# Patient Record
Sex: Female | Born: 1960 | Race: White | Hispanic: No | Marital: Married | Smoking: Never smoker
Health system: Southern US, Community
[De-identification: ages and names within clinical notes are randomized; demographics above are authoritative.]

## PROBLEM LIST (undated history)

## (undated) DIAGNOSIS — R748 Abnormal levels of other serum enzymes: Secondary | ICD-10-CM

## (undated) DIAGNOSIS — R42 Dizziness and giddiness: Secondary | ICD-10-CM

## (undated) DIAGNOSIS — R7301 Impaired fasting glucose: Secondary | ICD-10-CM

## (undated) DIAGNOSIS — H6690 Otitis media, unspecified, unspecified ear: Secondary | ICD-10-CM

## (undated) DIAGNOSIS — C4491 Basal cell carcinoma of skin, unspecified: Secondary | ICD-10-CM

## (undated) DIAGNOSIS — M7989 Other specified soft tissue disorders: Secondary | ICD-10-CM

## (undated) DIAGNOSIS — M545 Low back pain, unspecified: Secondary | ICD-10-CM

## (undated) HISTORY — DX: Basal cell carcinoma of skin, unspecified: C44.91

## (undated) HISTORY — PX: INNER EAR SURGERY: SHX679

## (undated) HISTORY — DX: Dizziness and giddiness: R42

## (undated) HISTORY — DX: Abnormal levels of other serum enzymes: R74.8

## (undated) HISTORY — DX: Other specified soft tissue disorders: M79.89

## (undated) HISTORY — DX: Low back pain, unspecified: M54.50

## (undated) HISTORY — DX: Impaired fasting glucose: R73.01

---

## 1999-07-08 ENCOUNTER — Encounter: Payer: Self-pay | Admitting: Obstetrics and Gynecology

## 1999-07-08 ENCOUNTER — Ambulatory Visit (HOSPITAL_COMMUNITY): Admission: RE | Admit: 1999-07-08 | Discharge: 1999-07-08 | Payer: Self-pay | Admitting: Obstetrics and Gynecology

## 1999-09-13 ENCOUNTER — Ambulatory Visit (HOSPITAL_COMMUNITY): Admission: RE | Admit: 1999-09-13 | Discharge: 1999-09-13 | Payer: Self-pay | Admitting: Obstetrics and Gynecology

## 1999-11-10 ENCOUNTER — Encounter (HOSPITAL_COMMUNITY): Admission: RE | Admit: 1999-11-10 | Discharge: 1999-12-06 | Payer: Self-pay | Admitting: Obstetrics and Gynecology

## 1999-11-17 ENCOUNTER — Encounter: Payer: Self-pay | Admitting: Obstetrics and Gynecology

## 1999-12-05 ENCOUNTER — Inpatient Hospital Stay (HOSPITAL_COMMUNITY): Admission: AD | Admit: 1999-12-05 | Discharge: 1999-12-07 | Payer: Self-pay | Admitting: Obstetrics and Gynecology

## 1999-12-08 ENCOUNTER — Encounter (HOSPITAL_COMMUNITY): Admission: RE | Admit: 1999-12-08 | Discharge: 2000-03-07 | Payer: Self-pay | Admitting: Obstetrics and Gynecology

## 2000-06-25 ENCOUNTER — Encounter: Payer: Self-pay | Admitting: Otolaryngology

## 2000-06-25 ENCOUNTER — Encounter: Admission: RE | Admit: 2000-06-25 | Discharge: 2000-06-25 | Payer: Self-pay | Admitting: Otolaryngology

## 2000-09-28 ENCOUNTER — Ambulatory Visit (HOSPITAL_BASED_OUTPATIENT_CLINIC_OR_DEPARTMENT_OTHER): Admission: RE | Admit: 2000-09-28 | Discharge: 2000-09-29 | Payer: Self-pay | Admitting: Otolaryngology

## 2000-09-28 ENCOUNTER — Encounter (INDEPENDENT_AMBULATORY_CARE_PROVIDER_SITE_OTHER): Payer: Self-pay | Admitting: *Deleted

## 2003-01-07 ENCOUNTER — Other Ambulatory Visit: Admission: RE | Admit: 2003-01-07 | Discharge: 2003-01-07 | Payer: Self-pay | Admitting: Obstetrics and Gynecology

## 2004-08-24 ENCOUNTER — Other Ambulatory Visit: Admission: RE | Admit: 2004-08-24 | Discharge: 2004-08-24 | Payer: Self-pay | Admitting: Obstetrics and Gynecology

## 2008-07-17 ENCOUNTER — Ambulatory Visit (HOSPITAL_COMMUNITY): Admission: RE | Admit: 2008-07-17 | Discharge: 2008-07-17 | Payer: Self-pay | Admitting: Obstetrics and Gynecology

## 2010-05-31 LAB — CBC
HCT: 38.1 % (ref 36.0–46.0)
Hemoglobin: 13.2 g/dL (ref 12.0–15.0)
MCHC: 34.6 g/dL (ref 30.0–36.0)
MCV: 93.3 fL (ref 78.0–100.0)
Platelets: 162 10*3/uL (ref 150–400)
RBC: 4.08 MIL/uL (ref 3.87–5.11)
RDW: 13.3 % (ref 11.5–15.5)
WBC: 6.1 10*3/uL (ref 4.0–10.5)

## 2010-05-31 LAB — PREGNANCY, URINE: Preg Test, Ur: NEGATIVE

## 2010-07-05 NOTE — Op Note (Signed)
NAMEBULA, CAVALIERI               ACCOUNT NO.:  [id number]   MEDICAL RECORD NO.:  [id number]          PATIENT TYPE:  AMB   LOCATION:  SDC                           FACILITY:  [facility]   PHYSICIAN:  Leighton Roach Meisinger, M.D.DATE OF BIRTH:  August 17, 1960   DATE OF PROCEDURE:  07/17/2008  DATE OF DISCHARGE:                               OPERATIVE REPORT   PREOPERATIVE DIAGNOSIS:  Mass in the right mons   POSTOPERATIVE DIAGNOSIS:  Questionable mass in the right mons.   PROCEDURE:  Exploration of the right mons.   SURGEON:  Zenaida Niece, MD   ANESTHESIA:  Local.   FINDINGS:  Externally, there was a possible mobile mass in the upper  right mons.  However, internally through an incision, no specific masses  palpated.   SPECIMENS:  None.   ESTIMATED BLOOD LOSS:  Minimal.   COMPLICATIONS:  None.   PROCEDURE IN DETAIL:  The patient was taken to the operating room and  placed in the dorsal supine position.  Prior to any anesthesia, I  palpated her right mons what was a fairly well-defined mass before I was  not able to palpate.  I was able to palpate a possible mobile mass in  the right superior mons.  This area was shaved and then prepped with  Betadine and draped with towels.  I was then able to palpate the area  and infiltrated 0.5% Marcaine with epinephrine over the area where a  mass was possibly palpated.  A 3-cm horizontal incision was then made.  This was carried with hemostats down to the layer just above the pubic  bone.  No unusual tissue or mass was encountered.  I put a finger in the  incision and palpated 360 degrees and palpated her entire mons with one  finger in the incision and I was unable to palpate a specific mass for  removal.  This was done very diligently over several minutes to make  sure that I was not missing anything.  No specific mass was palpated.  The patient was awake, so I discussed this with her during the case.  The deep layers were reapproximated with  figure-of-eight suture of 2-0  Vicryl.  Skin was closed with a subcuticular suture of 4-0 Vicryl  followed by Dermabond.  The patient tolerated the procedure well and was  taken to the recovery in stable condition.  Counts were correct.      Zenaida Niece, M.D.  Electronically Signed    TDM/MEDQ  D:  07/17/2008  T:  07/18/2008  Job:  [id number]

## 2010-07-08 NOTE — Op Note (Signed)
[place]  Patient:    Meagan Washington, Meagan Washington                      MRN: [id number] Proc. Date: 09/28/00 Adm. Date:  [id number] Disc. 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  POSTOPERATIVE DIAGNOSIS:  Mastoid and middle ear cholesteatoma.  OPERATION PERFORMED:  Canal wall down tympanomastoidectomy (modified radical mastoidectomy).  SURGEON:  Jefry H. Pollyann Kennedy, M.D.  ANESTHESIA:  General endotracheal.  COMPLICATIONS:  None.  FINDINGS:  Cholesteatoma matrix filling the superior two thirds of the mastoid cavity, extending high into the sinodural angle, extending superiorly just medial to the lower aspect of the tegmen.  Filling the aditus and epitympanum, filling the superior 50% of the middle ear cavity and extending into the eustachian tube orifice as well and as well as into the zygomatic process. Facial nerve canal was all intact except for a small maybe 2 or 3 mm area of the external genu.  Ossicular chain:  The head of the malleus was severely diseased and carious.  The lenticular process of the incus was eroded.  The capitulum of the lower aspect of the capitulum of the stapes and one of the crura was partially intact.  The remainder of the superstructure could not be identified.  The oval window was filled with fibrotic type tissue and was left unmolested.  The semicircular canals were intact.  There was no bony fistula. The patient tolerated the procedure well, was awakened, extubated and transferred to recovery in good condition.  In recovery, the tuning fork lateralized to the left ear with a Weber examination and her facial nerve was all intact.  INDICATIONS FOR PROCEDURE:  The patient is a 50 year old with a two-year history of chronic draining ear with aural polyps not able to  be cleared using medical therapy.  The risks, benefits, alternatives and complications of the procedure were explained to the patient who seemed to understand and agreed to surgery.  DESCRIPTION OF PROCEDURE:  The patient was taken to the operating room and placed on the operating table in the supine position.  Following induction of general endotracheal anesthesia, the ear was prepped and draped in standard fashion.  1% Xylocaine with epinephrine was infiltrated into the postauricular sulcus and four quadrants of the external auditory canal.  A vascular strip was created in the posterior ear canal and the postauricular incision was accomplished using a #10 blade.  The ear was brought forward.  Temporalis fascia and loose areolar tissue were both harvested, pressed and dried on the back table.  The mastoid periosteum was incised at the linea temporalis and vertically and dissected off the mastoid process.  Mastoidectomy was then performed using a combination of different sized cutting and polishing diamond burs.  As the cortex was opened, the cholesteatoma was immediately identified. A complete mastoidectomy was performed exposing the hard bone of the otic capsule, the three semicircular canals.  The bone was kept thick around the sigmoid sinus as there was no disease inferiorly in this area.  The sinodural angle was dissected until all of the cholesteatoma matrix could adequately be removed.  The dissection was continued through anterior to the zygomatic arch root until all disease could  be removed.  The canal wall was taken down after it was determined that there was extensive disease in the anterior tympanic cavity as well as the facial recess and sinus tympany area.  All the above-mentioned findings were noted during the dissection.  Care was taken not to inadvertently drill on the ossicles and care was taken to avoid the exposed area of the facial nerve.  The canal wall was taken down  all the way to the facial ridge which was kept intact.  All disease was removed.  The malleus head was removed using malleus nippers removing all of the disease from the anterior epitympanum.  The incus was separated from the stapes capitulum and was completely removed as well.  The small piece of stapes superstructure was encased within the fibrotic tissue that was found in the oval window area and that was left in place.  The round window niche was clear.  The hypotympanum was clear of disease as well.  The bony bridge overlying the fossa incudus was removed and a wide open cavity was created.  All diseased epithelium was removed and sent for pathologic evaluation.  The anterior half of the tympanic membrane was kept intact.  The posterior half was removed with the disease. The graft was then placed under the tympanic membrane and draped along the aditus area and into the mastoid cavity.  Saline soaked Gelfoam was used to support the graft to the middle ear and the remainder of the cavity was packed with Cortisporin soaked Gelfoam.  A large meatoplasty was accomplished by incising the skin and the cartilage of the inferior and  superior incisura and tacking back the conchal bowl using chromic suture.  The postauricular incision was reapproximated with chromic suture and benzoin and Steri-Strips. The remainder of the cavity was packed with Gelfoam soaked in Cortisporin and then with Adaptic dressing soaked in ointment.  A mastoid dressing was then applied.  The patient was then awakened, extubated and transferred to recovery in stable condition. DD:  10/01/00 TD:  10/01/00 Job: [id number]

## 2010-07-08 NOTE — Discharge Summary (Signed)
[facility]  Patient:    Meagan Washington, Meagan Washington                      MRN: [id number] Adm. Date:  [id number] Disc. Date: [id number] Attending:  Michaele Offer                           Discharge Summary  ADMISSION DIAGNOSES:          1. Intrauterine pregnancy at 39 weeks.                               2. Advanced maternal age.  DISCHARGE DIAGNOSES:          1. Intrauterine pregnancy at 39 weeks.                               2. Advanced maternal age.  PROCEDURE:                    Spontaneous vaginal delivery.  COMPLICATIONS:                None.  CONSULTATIONS:                None.  HISTORY AND PHYSICAL:         This is a 50 year old white female gravida 6, para 2-0-3-2 with an EGA of 39+ weeks by a 10-week ultrasound with an EDC of December 07, 1999, who presents for induction due to a favorable cervix.  She has had normal fetal movement.  No bleeding or ruptured membranes and occasional contractions.  Prenatal care complicated by advanced maternal age and the patient declined amniocentesis and triple screen and had a normal ultrasound.  She took baby aspirin throughout the pregnancy for history of spontaneous abortion x 3.  She is Rh negative and received RhoGAM and recently measured size less than dates and has had reactive nonstress test and ultrasound on November 17, 1999, revealed an estimated fetal weight of approximately 2800 g with a normal amniotic fluid volume.  PRENATAL LABS:                Blood type A negative with negative antibody screen. RPR nonreactive. Rubella immune. Hepatitis B surface antigen negative. Gonorrhea and chlamydia negative.  Glucola was 117 and group B Strep is negative.  PAST OB HISTORY:              Three spontaneous abortions and she had two D&Cs for this.  In 1996, vaginal delivery at 39 weeks, 8 pounds 10 ounces without complications.  In 1998, vaginal delivery at 39 weeks, 8 pounds 9 ounces without  complications.  PAST SURGICAL HISTORY:        She had jaw surgery and the two D&Cs.  The remainder of her history is noncontributory.  PHYSICAL EXAMINATION:  VITAL SIGNS:                  She is afebrile with stable vital signs. Fetal heart tracing is reactive.  She has irregular contractions.  ABDOMEN:                      Her abdomen is gravid and nontender with an estimated fetal weight of 8 pounds.  VAGINAL:  Vaginal examination is 4, 30, -2 with a vertex presentation and an adequate pelvis.  HOSPITAL COURSE:              The patient was admitted and had artificial rupture of membranes performed for labor induction.  She then progressed into labor on her own but did have a protracted course once she reached 5 cm.  She received an epidural and was started on Pitocin.  With minimal Pitocin she progressed to complete and pushed well.  On the afternoon of December 05, 1999, she had an SVD of a viable female infant with Apgars of 8 and 9 that weighed 7 pounds 3 ounces over a second degree laceration.  There was a nuchal cord x 1 which was reduced.  Placenta delivered spontaneously and was intact.  Her laceration was repaired with 3-0 Vicryl with local block.  Estimated blood loss was approximately 500 cc.  Postpartum she did very well and breast-fed her baby without complications and remained afebrile.  Predelivery hemoglobin was 11.9, post delivery was 11.3.  On the morning of postpartum day #2 she was stable for discharge home.  CONDITION ON DISCHARGE:       Stable.  DISPOSITION:                  The patient is discharged to home.  DISCHARGE INSTRUCTIONS:       1. Diet is regular.                               2. Activity is pelvic rest.                               3. She will follow up in four to six weeks.                               4. She was given our discharge pamphlet. DD:  12/07/99 TD:  12/07/99 Job: [id number]

## 2014-08-22 ENCOUNTER — Encounter (HOSPITAL_COMMUNITY): Payer: Self-pay | Admitting: Nurse Practitioner

## 2014-08-22 ENCOUNTER — Emergency Department (HOSPITAL_COMMUNITY)
Admission: EM | Admit: 2014-08-22 | Discharge: 2014-08-22 | Disposition: A | Discharge status: Home / Self Care | Payer: BLUE CROSS/BLUE SHIELD | Attending: Emergency Medicine | Admitting: Emergency Medicine

## 2014-08-22 DIAGNOSIS — H8110 Benign paroxysmal vertigo, unspecified ear: Secondary | ICD-10-CM | POA: Insufficient documentation

## 2014-08-22 DIAGNOSIS — R42 Dizziness and giddiness: Secondary | ICD-10-CM | POA: Diagnosis present

## 2014-08-22 HISTORY — DX: Otitis media, unspecified, unspecified ear: H66.90

## 2014-08-22 LAB — CBG MONITORING, ED: Glucose-Capillary: 139 mg/dL — ABNORMAL HIGH (ref 65–99)

## 2014-08-22 MED ORDER — DIAZEPAM 5 MG PO TABS: 5.0000 mg | ORAL_TABLET | Freq: Two times a day (BID) | ORAL | Status: DC

## 2014-08-22 MED ORDER — ONDANSETRON HCL 4 MG/2ML IJ SOLN
4.0000 mg | Freq: Once | INTRAMUSCULAR | Status: AC
  Administered 2014-08-22: 4 mg via INTRAVENOUS
  Filled 2014-08-22: qty 2

## 2014-08-22 MED ORDER — DIAZEPAM 5 MG/ML IJ SOLN: 5.0000 mg | Freq: Once | INTRAMUSCULAR | Status: DC

## 2014-08-22 MED ORDER — MECLIZINE HCL 25 MG PO TABS: ORAL_TABLET | ORAL | Status: DC

## 2014-08-22 MED ORDER — MECLIZINE HCL 25 MG PO TABS
25.0000 mg | ORAL_TABLET | Freq: Once | ORAL | Status: AC
  Administered 2014-08-22: 25 mg via ORAL
  Filled 2014-08-22: qty 1

## 2014-08-22 MED ORDER — DIAZEPAM 5 MG PO TABS
5.0000 mg | ORAL_TABLET | Freq: Once | ORAL | Status: AC
  Administered 2014-08-22: 5 mg via ORAL
  Filled 2014-08-22: qty 1

## 2014-08-22 MED ORDER — FLUTICASONE PROPIONATE 50 MCG/ACT NA SUSP: NASAL | Status: DC

## 2014-08-22 MED ORDER — ONDANSETRON 4 MG PO TBDP: 4.0000 mg | ORAL_TABLET | Freq: Three times a day (TID) | ORAL | Status: DC | PRN

## 2014-08-22 MED ORDER — DIPHENHYDRAMINE HCL 50 MG/ML IJ SOLN
25.0000 mg | Freq: Once | INTRAMUSCULAR | Status: AC
  Administered 2014-08-22: 25 mg via INTRAVENOUS
  Filled 2014-08-22: qty 1

## 2014-08-22 NOTE — ED Provider Notes (Signed)
CSN: [id number]     Arrival date & time 08/22/14  1829 History   First MD Initiated Contact with Patient 08/22/14 1907     Chief Complaint  Patient presents with  . Dizziness      HPI  She presents for evaluation of "dizziness". States she was at home earlier today and felt some popping and pressure in her right ear. No associated with coughing or sneezing or Valsalva. Sudden episode of spinning dizziness associated nausea. She presents here with classic vertigo symptoms. No headache no neck pain no head trauma. No past similar episodes.  Past Medical History  Diagnosis Date  . Ear infection    Past Surgical History  Procedure Laterality Date  . Inner ear surgery     History reviewed. No pertinent family history. History  Substance Use Topics  . Smoking status: Never Smoker   . Smokeless tobacco: Not on file  . Alcohol Use: No   OB History    No data available     Review of Systems  Constitutional: Negative for fever, chills, diaphoresis, appetite change and fatigue.  HENT: Negative for mouth sores, sore throat and trouble swallowing.   Eyes: Negative for visual disturbance.  Respiratory: Negative for cough, chest tightness, shortness of breath and wheezing.   Cardiovascular: Negative for chest pain.  Gastrointestinal: Positive for nausea. Negative for vomiting, abdominal pain, diarrhea and abdominal distention.  Endocrine: Negative for polydipsia, polyphagia and polyuria.  Genitourinary: Negative for dysuria, frequency and hematuria.  Musculoskeletal: Negative for gait problem.  Skin: Negative for color change, pallor and rash.  Neurological: Positive for dizziness. Negative for syncope, light-headedness and headaches.  Hematological: Does not bruise/bleed easily.  Psychiatric/Behavioral: Negative for behavioral problems and confusion.      Allergies  Review of patient's allergies indicates no known allergies.  Home Medications   Prior to Admission medications    Medication Sig Start Date End Date Taking? Authorizing Provider  naproxen sodium (ANAPROX) 220 MG tablet Take 220 mg by mouth 2 (two) times daily as needed (for pain).   Yes Historical Provider, MD  diazepam (VALIUM) 5 MG tablet Take 1 tablet (5 mg total) by mouth 2 (two) times daily. 08/22/14   Tanna Furry, MD  fluticasone (FLONASE) 50 MCG/ACT nasal spray 1 spray each nares twice a day. 08/22/14   Tanna Furry, MD  meclizine (ANTIVERT) 25 MG tablet Take until 24 hours without dizziness 08/22/14   Tanna Furry, MD  ondansetron (ZOFRAN ODT) 4 MG disintegrating tablet Take 1 tablet (4 mg total) by mouth every 8 (eight) hours as needed for nausea. 08/22/14   Tanna Furry, MD   BP 111/69 mmHg  Pulse 70  Temp(Src) 97.9 F (36.6 C) (Oral)  Resp 13  Ht 5\' 10"  (1.778 m)  Wt 185 lb (83.915 kg)  BMI 26.54 kg/m2  SpO2 97% Physical Exam  Constitutional: She is oriented to person, place, and time. She appears well-developed and well-nourished. No distress.  HENT:  Head: Normocephalic.  Right TM is retracted. Does not appear inflamed or bulging.  Eyes: Conjunctivae are normal. Pupils are equal, round, and reactive to light. No scleral icterus.  Neck: Normal range of motion. Neck supple. No thyromegaly present.  Cardiovascular: Normal rate and regular rhythm.  Exam reveals no gallop and no friction rub.   No murmur heard. Pulmonary/Chest: Effort normal and breath sounds normal. No respiratory distress. She has no wheezes. She has no rales.  Abdominal: Soft. Bowel sounds are normal. She exhibits no distension. There  is no tenderness. There is no rebound.  Musculoskeletal: Normal range of motion.  Neurological: She is alert and oriented to person, place, and time.  Nystagmus to lateral gaze. No other cranial nerve deficits.  Skin: Skin is warm and dry. No rash noted.  Psychiatric: She has a normal mood and affect. Her behavior is normal.    ED Course  Procedures (including critical care time) Labs Review Labs  Reviewed  CBG MONITORING, ED - Abnormal; Notable for the following:    Glucose-Capillary 139 (*)    All other components within normal limits    Imaging Review No results found.   EKG Interpretation None      MDM   Final diagnoses:  Vertigo, benign positional, unspecified laterality    Patient with improvement of symptoms. Not completed resolved. No additional neurological symptoms. She is able to sit stand transfer with minimal symptoms. Felt comfortable with discharge. Her husband has had intermittent vertigo for years. She follows with Dr. Constance Holster of ENT for chronic ear infections. Recommended ENT follow-up if not improving after completion of medications. Prescription for Antivert. Prescription for a friend. Antivert 24 hours of symptoms. Driving precautions noted. ER with any changes.    Tanna Furry, MD 08/22/14 2308

## 2014-08-22 NOTE — ED Notes (Addendum)
Pt reports she felt popping sensation in R ear today and shes felt dizzy and nauseated since. She denies any pain. She has a history of ear infections. She is A&Ox4, resp e/u, no neuro deficits noted. She states she has not eaten well today as she has been too busy taking care of kids

## 2014-08-22 NOTE — Discharge Instructions (Signed)
Benign Positional Vertigo Vertigo means you feel like you or your surroundings are moving when they are not. Benign positional vertigo is the most common form of vertigo. Benign means that the cause of your condition is not serious. Benign positional vertigo is more common in older adults. CAUSES  Benign positional vertigo is the result of an upset in the labyrinth system. This is an area in the middle ear that helps control your balance. This may be caused by a viral infection, head injury, or repetitive motion. However, often no specific cause is found. SYMPTOMS  Symptoms of benign positional vertigo occur when you move your head or eyes in different directions. Some of the symptoms may include:  Loss of balance and falls.  Vomiting.  Blurred vision.  Dizziness.  Nausea.  Involuntary eye movements (nystagmus). DIAGNOSIS  Benign positional vertigo is usually diagnosed by physical exam. If the specific cause of your benign positional vertigo is unknown, your caregiver may perform imaging tests, such as magnetic resonance imaging (MRI) or computed tomography (CT). TREATMENT  Your caregiver may recommend movements or procedures to correct the benign positional vertigo. Medicines such as meclizine, benzodiazepines, and medicines for nausea may be used to treat your symptoms. In rare cases, if your symptoms are caused by certain conditions that affect the inner ear, you may need surgery. HOME CARE INSTRUCTIONS   Follow your caregiver's instructions.  Move slowly. Do not make sudden body or head movements.  Avoid driving.  Avoid operating heavy machinery.  Avoid performing any tasks that would be dangerous to you or others during a vertigo episode.  Drink enough fluids to keep your urine clear or pale yellow. SEEK IMMEDIATE MEDICAL CARE IF:   You develop problems with walking, weakness, numbness, or using your arms, hands, or legs.  You have difficulty speaking.  You develop  severe headaches.  Your nausea or vomiting continues or gets worse.  You develop visual changes.  Your family or friends notice any behavioral changes.  Your condition gets worse.  You have a fever.  You develop a stiff neck or sensitivity to light. MAKE SURE YOU:   Understand these instructions.  Will watch your condition.  Will get help right away if you are not doing well or get worse. Document Released: 11/14/2005 Document Revised: 05/01/2011 Document Reviewed: 10/27/2010 Exit[employer] Patient Information 2015 [employer]. This information is not intended to replace advice given to you by your health care provider. Make sure you discuss any questions you have with your health care provider.    

## 2014-08-22 NOTE — ED Notes (Signed)
Pt placed in a gown and hooked up to the monitor with BP cuff and pulse ox 

## 2016-08-18 DIAGNOSIS — Z298 Encounter for other specified prophylactic measures: Secondary | ICD-10-CM | POA: Diagnosis not present

## 2016-08-18 DIAGNOSIS — Z23 Encounter for immunization: Secondary | ICD-10-CM | POA: Diagnosis not present

## 2016-11-14 DIAGNOSIS — M25571 Pain in right ankle and joints of right foot: Secondary | ICD-10-CM | POA: Diagnosis not present

## 2016-11-14 DIAGNOSIS — M25572 Pain in left ankle and joints of left foot: Secondary | ICD-10-CM | POA: Diagnosis not present

## 2016-11-14 DIAGNOSIS — M722 Plantar fascial fibromatosis: Secondary | ICD-10-CM | POA: Diagnosis not present

## 2018-02-21 ENCOUNTER — Other Ambulatory Visit: Payer: Self-pay | Admitting: Physician Assistant

## 2018-02-21 ENCOUNTER — Other Ambulatory Visit (HOSPITAL_COMMUNITY)
Admission: RE | Admit: 2018-02-21 | Discharge: 2018-02-21 | Disposition: A | Discharge status: Home / Self Care | Payer: BLUE CROSS/BLUE SHIELD | Source: Ambulatory Visit | Attending: Physician Assistant | Admitting: Physician Assistant

## 2018-02-21 DIAGNOSIS — Z0000 Encounter for general adult medical examination without abnormal findings: Secondary | ICD-10-CM | POA: Diagnosis not present

## 2018-02-21 DIAGNOSIS — Z13220 Encounter for screening for lipoid disorders: Secondary | ICD-10-CM | POA: Diagnosis not present

## 2018-02-26 LAB — CYTOLOGY - PAP
Adequacy: ABSENT
Diagnosis: NEGATIVE
HPV: NOT DETECTED

## 2019-01-14 ENCOUNTER — Other Ambulatory Visit: Payer: Self-pay

## 2019-01-14 DIAGNOSIS — Z20822 Contact with and (suspected) exposure to covid-19: Secondary | ICD-10-CM

## 2019-01-15 LAB — NOVEL CORONAVIRUS, NAA: SARS-CoV-2, NAA: NOT DETECTED

## 2019-03-18 DIAGNOSIS — M1712 Unilateral primary osteoarthritis, left knee: Secondary | ICD-10-CM | POA: Diagnosis not present

## 2019-03-18 DIAGNOSIS — M25562 Pain in left knee: Secondary | ICD-10-CM | POA: Diagnosis not present

## 2019-04-11 DIAGNOSIS — Z0000 Encounter for general adult medical examination without abnormal findings: Secondary | ICD-10-CM | POA: Diagnosis not present

## 2019-04-11 DIAGNOSIS — R7301 Impaired fasting glucose: Secondary | ICD-10-CM | POA: Diagnosis not present

## 2019-04-11 DIAGNOSIS — Z13220 Encounter for screening for lipoid disorders: Secondary | ICD-10-CM | POA: Diagnosis not present

## 2019-04-14 DIAGNOSIS — C44319 Basal cell carcinoma of skin of other parts of face: Secondary | ICD-10-CM | POA: Diagnosis not present

## 2019-05-07 DIAGNOSIS — C44319 Basal cell carcinoma of skin of other parts of face: Secondary | ICD-10-CM | POA: Diagnosis not present

## 2019-05-12 DIAGNOSIS — M1712 Unilateral primary osteoarthritis, left knee: Secondary | ICD-10-CM | POA: Diagnosis not present

## 2019-07-22 DIAGNOSIS — Z08 Encounter for follow-up examination after completed treatment for malignant neoplasm: Secondary | ICD-10-CM | POA: Diagnosis not present

## 2019-12-22 DIAGNOSIS — Z20822 Contact with and (suspected) exposure to covid-19: Secondary | ICD-10-CM | POA: Diagnosis not present

## 2021-06-14 ENCOUNTER — Ambulatory Visit
Admission: RE | Admit: 2021-06-14 | Discharge: 2021-06-14 | Disposition: A | Discharge status: Home / Self Care | Payer: Self-pay | Source: Ambulatory Visit | Attending: Family Medicine | Admitting: Family Medicine

## 2021-06-14 ENCOUNTER — Other Ambulatory Visit: Payer: Self-pay | Admitting: Family Medicine

## 2021-06-14 DIAGNOSIS — M545 Low back pain, unspecified: Secondary | ICD-10-CM | POA: Diagnosis not present

## 2021-06-14 DIAGNOSIS — R0602 Shortness of breath: Secondary | ICD-10-CM

## 2021-06-14 DIAGNOSIS — M7989 Other specified soft tissue disorders: Secondary | ICD-10-CM | POA: Diagnosis not present

## 2021-06-14 DIAGNOSIS — R059 Cough, unspecified: Secondary | ICD-10-CM | POA: Diagnosis not present

## 2021-06-14 DIAGNOSIS — R7301 Impaired fasting glucose: Secondary | ICD-10-CM | POA: Diagnosis not present

## 2021-06-27 DIAGNOSIS — M545 Low back pain, unspecified: Secondary | ICD-10-CM | POA: Diagnosis not present

## 2021-06-27 DIAGNOSIS — M7989 Other specified soft tissue disorders: Secondary | ICD-10-CM | POA: Diagnosis not present

## 2021-07-07 DIAGNOSIS — M542 Cervicalgia: Secondary | ICD-10-CM | POA: Diagnosis not present

## 2021-07-07 DIAGNOSIS — M545 Low back pain, unspecified: Secondary | ICD-10-CM | POA: Diagnosis not present

## 2021-07-13 DIAGNOSIS — R748 Abnormal levels of other serum enzymes: Secondary | ICD-10-CM | POA: Diagnosis not present

## 2021-07-14 ENCOUNTER — Other Ambulatory Visit: Payer: Self-pay | Admitting: Family Medicine

## 2021-07-14 DIAGNOSIS — R748 Abnormal levels of other serum enzymes: Secondary | ICD-10-CM

## 2021-07-21 ENCOUNTER — Ambulatory Visit
Admission: RE | Admit: 2021-07-21 | Discharge: 2021-07-21 | Disposition: A | Discharge status: Home / Self Care | Payer: BC Managed Care – PPO | Source: Ambulatory Visit | Attending: Family Medicine | Admitting: Family Medicine

## 2021-07-21 DIAGNOSIS — R748 Abnormal levels of other serum enzymes: Secondary | ICD-10-CM

## 2021-07-21 DIAGNOSIS — R945 Abnormal results of liver function studies: Secondary | ICD-10-CM | POA: Diagnosis not present

## 2021-08-08 ENCOUNTER — Other Ambulatory Visit: Payer: BC Managed Care – PPO

## 2021-08-11 ENCOUNTER — Other Ambulatory Visit: Payer: BC Managed Care – PPO

## 2021-11-10 DIAGNOSIS — M25572 Pain in left ankle and joints of left foot: Secondary | ICD-10-CM | POA: Diagnosis not present

## 2021-12-09 DIAGNOSIS — R2689 Other abnormalities of gait and mobility: Secondary | ICD-10-CM | POA: Diagnosis not present

## 2021-12-14 ENCOUNTER — Other Ambulatory Visit: Payer: Self-pay | Admitting: Physician Assistant

## 2021-12-14 DIAGNOSIS — R2689 Other abnormalities of gait and mobility: Secondary | ICD-10-CM

## 2022-01-03 ENCOUNTER — Ambulatory Visit
Admission: RE | Admit: 2022-01-03 | Discharge: 2022-01-03 | Disposition: A | Discharge status: Home / Self Care | Payer: BC Managed Care – PPO | Source: Ambulatory Visit | Attending: Physician Assistant | Admitting: Physician Assistant

## 2022-01-03 DIAGNOSIS — R2689 Other abnormalities of gait and mobility: Secondary | ICD-10-CM

## 2022-01-03 DIAGNOSIS — I6789 Other cerebrovascular disease: Secondary | ICD-10-CM | POA: Diagnosis not present

## 2022-01-03 MED ORDER — GADOPICLENOL 0.5 MMOL/ML IV SOLN
10.0000 mL | Freq: Once | INTRAVENOUS | Status: AC | PRN
  Administered 2022-01-03: 10 mL via INTRAVENOUS

## 2022-02-01 ENCOUNTER — Encounter: Payer: Self-pay | Admitting: Diagnostic Neuroimaging

## 2022-02-01 ENCOUNTER — Ambulatory Visit (INDEPENDENT_AMBULATORY_CARE_PROVIDER_SITE_OTHER): Payer: BC Managed Care – PPO | Admitting: Diagnostic Neuroimaging

## 2022-02-01 VITALS — BP 131/71 | HR 78 | Ht 69.0 in | Wt 209.0 lb

## 2022-02-01 DIAGNOSIS — R269 Unspecified abnormalities of gait and mobility: Secondary | ICD-10-CM | POA: Diagnosis not present

## 2022-02-01 DIAGNOSIS — M5416 Radiculopathy, lumbar region: Secondary | ICD-10-CM

## 2022-02-01 DIAGNOSIS — G250 Essential tremor: Secondary | ICD-10-CM

## 2022-02-01 DIAGNOSIS — R531 Weakness: Secondary | ICD-10-CM

## 2022-02-01 DIAGNOSIS — R292 Abnormal reflex: Secondary | ICD-10-CM

## 2022-02-01 DIAGNOSIS — R2689 Other abnormalities of gait and mobility: Secondary | ICD-10-CM | POA: Diagnosis not present

## 2022-02-01 NOTE — Progress Notes (Signed)
[facility]  PATIENT: Meagan Washington DOB: 27-Apr-1960  REFERRING CLINICIAN: Turmel, Lenord Fellers, PA HISTORY FROM: patient  REASON FOR VISIT: new consult   HISTORICAL  CHIEF COMPLAINT:  Chief Complaint  Patient presents with   Gait Problem    Rm 7 alone Pt is well, has been having imbalance for over a yr. It comes and goes, not consistent.     HISTORY OF PRESENT ILLNESS:   61 year old female here for evaluation of balance and gait difficulty.  For past 1 year patient has had intermittent episodes lasting up to 1 week at a time of gait and balance difficulty.  This typically affects her when she is standing and walking but not sitting or driving.  Sometimes associated with throbbing sensation in the head, shaking vision.  No nausea or vomiting.  Symptoms she has a rocking boat sensation like it is going back and forth.  She has some voice tremor.  She is under significant stress.  She is having some memory issues.  Has some numbness in the feet.   REVIEW OF SYSTEMS: Full 14 system review of systems performed and negative with exception of: as per HPI.  ALLERGIES: No Known Allergies  HOME MEDICATIONS: Outpatient Medications Prior to Visit  Medication Sig Dispense Refill   Multiple Vitamin (MULTIVITAMIN) tablet Take 1 tablet by mouth daily.     diazepam (VALIUM) 5 MG tablet Take 1 tablet (5 mg total) by mouth 2 (two) times daily. 20 tablet 0   fluticasone (FLONASE) 50 MCG/ACT nasal spray 1 spray each nares twice a day. 10 g 1   meclizine (ANTIVERT) 25 MG tablet Take until 24 hours without dizziness 28 tablet 0   naproxen sodium (ANAPROX) 220 MG tablet Take 220 mg by mouth 2 (two) times daily as needed (for pain).     ondansetron (ZOFRAN ODT) 4 MG disintegrating tablet Take 1 tablet (4 mg total) by mouth every 8 (eight) hours as needed for nausea. 20 tablet 0   No facility-administered medications prior to visit.    PAST MEDICAL HISTORY: Past Medical  History:  Diagnosis Date   Acute right-sided low back pain    Basal cell carcinoma    Forehead   Ear infection    Elevated fasting glucose    Elevated liver enzymes    Leg swelling    Vertigo     PAST SURGICAL HISTORY: Past Surgical History:  Procedure Laterality Date   INNER EAR SURGERY      FAMILY HISTORY: History reviewed. No pertinent family history.  SOCIAL HISTORY: Social History   Socioeconomic History   Marital status: Married    Spouse name: Not on file   Number of children: Not on file   Years of education: Not on file   Highest education level: Not on file  Occupational History   Not on file  Tobacco Use   Smoking status: Never   Smokeless tobacco: Not on file  Substance and Sexual Activity   Alcohol use: No   Drug use: No   Sexual activity: Not on file  Other Topics Concern   Not on file  Social History Narrative   Not on file   Social Determinants of Health   Financial Resource Strain: Not on file  Food Insecurity: Not on file  Transportation Needs: Not on file  Physical Activity: Not on file  Stress: Not on file  Social Connections: Not on file  Intimate Partner Violence: Not on file  PHYSICAL EXAM  GENERAL EXAM/CONSTITUTIONAL: Vitals:  Vitals:   02/01/22 0843  BP: 131/71  Pulse: 78  Weight: 209 lb (94.8 kg)  Height: '5\' 9"'$  (1.753 m)   Body mass index is 30.86 kg/m. Wt Readings from Last 3 Encounters:  02/01/22 209 lb (94.8 kg)  08/22/14 185 lb (83.9 kg)   Patient is in no distress; well developed, nourished and groomed; neck is supple  CARDIOVASCULAR: Examination of carotid arteries is normal; no carotid bruits Regular rate and rhythm, no murmurs Examination of peripheral vascular system by observation and palpation is normal  EYES: Ophthalmoscopic exam of optic discs and posterior segments is normal; no papilledema or hemorrhages No results found.  MUSCULOSKELETAL: Gait, strength, tone, movements noted in  Neurologic exam below  NEUROLOGIC: MENTAL STATUS:      No data to display         awake, alert, oriented to person, place and time recent and remote memory intact normal attention and concentration language fluent, comprehension intact, naming intact fund of knowledge appropriate  CRANIAL NERVE:  2nd - no papilledema on fundoscopic exam 2nd, 3rd, 4th, 6th - pupils equal and reactive to light, visual fields full to confrontation, extraocular muscles intact, no nystagmus 5th - facial sensation symmetric 7th - facial strength symmetric 8th - hearing intact 9th - palate elevates symmetrically, uvula midline 11th - shoulder shrug symmetric 12th - tongue protrusion midline  MOTOR:  normal bulk and tone, full strength in the BUE, BLE; BILATERAL DELTOIDS 4+; RIGHT HIP FLEX 4; KNEE EXT 4+  SENSORY:  normal and symmetric to light touch, temperature, vibration  COORDINATION:  finger-nose-finger, fine finger movements normal  REFLEXES:  deep tendon reflexes BRISK AND SYMM; BICEPS 2+, TRICEPS 3, KNEES 2+, ANKLES 2 and symmetric; NEG HOFFMANS  GAIT/STATION:  narrow based gait     DIAGNOSTIC DATA (LABS, IMAGING, TESTING) - I reviewed patient records, labs, notes, testing and imaging myself where available.  Lab Results  Component Value Date   WBC 6.1 07/17/2008   HGB 13.2 07/17/2008   HCT 38.1 07/17/2008   MCV 93.3 07/17/2008   PLT 162 07/17/2008   No results found for: "NA", "K", "CL", "CO2", "GLUCOSE", "BUN", "CREATININE", "CALCIUM", "PROT", "ALBUMIN", "AST", "ALT", "ALKPHOS", "BILITOT", "GFRNONAA", "GFRAA" No results found for: "CHOL", "HDL", "LDLCALC", "LDLDIRECT", "TRIG", "CHOLHDL" No results found for: "HGBA1C" No results found for: "VITAMINB12" No results found for: "TSH"  01/03/22 MRI brain [I reviewed images myself and agree with interpretation. -VRP]  1. No acute intracranial pathology. 2. Small nonenhancing foci of FLAIR signal abnormality in the  right frontal white matter are nonspecific and most commonly seen in the setting of chronic small-vessel ischemic change; however, demyelinating disease could have a similar appearance (but is considered less likely). 3. Right mastoid effusion.   ASSESSMENT AND PLAN  61 y.o. year old female here with:   Dx:  1. Gait difficulty   2. Hyperreflexia   3. Lumbar radiculopathy   4. Balance disorder   5. Weakness   6. Essential tremor      PLAN:  GAIT DIFF / DIZZINESS (since 1 year; intermittent) - check labs (myopathy, neuropathy) - check MRI cervical spine (rule out cervical myelopathy; hyperreflexia; gait imbalance) - check MRI lumbar spine (right lumbar radiculopathy)  ESSENTIAL TREMOR (voice, head, arms) - mild; monitor for now  Orders Placed This Encounter  Procedures   MR CERVICAL SPINE W WO CONTRAST   MR LUMBAR SPINE WO CONTRAST   Vitamin B12   CK  Aldolase   TSH   Comprehensive metabolic panel   Return for pending test results, pending if symptoms worsen or fail to improve.  I reviewed images, labs, notes, records myself. I summarized findings and reviewed with patient, for this high risk condition (cervical myelopathy; gait diff) requiring high complexity decision making.    Penni Bombard, MD 06/02/6436, 3:77 AM Certified in Neurology, Neurophysiology and Neuroimaging  [facility] [address] [place], [place] [address] [phone]

## 2022-02-03 LAB — ALDOLASE: Aldolase: 7 U/L (ref 3.3–10.3)

## 2022-02-03 LAB — COMPREHENSIVE METABOLIC PANEL
ALT: 40 IU/L — ABNORMAL HIGH (ref 0–32)
AST: 48 IU/L — ABNORMAL HIGH (ref 0–40)
Albumin/Globulin Ratio: 2.6 — ABNORMAL HIGH (ref 1.2–2.2)
Albumin: 4.7 g/dL (ref 3.8–4.9)
Alkaline Phosphatase: 93 IU/L (ref 44–121)
BUN/Creatinine Ratio: 15 (ref 9–23)
BUN: 13 mg/dL (ref 6–24)
Bilirubin Total: 0.5 mg/dL (ref 0.0–1.2)
CO2: 24 mmol/L (ref 20–29)
Calcium: 10.1 mg/dL (ref 8.7–10.2)
Chloride: 106 mmol/L (ref 96–106)
Creatinine, Ser: 0.89 mg/dL (ref 0.57–1.00)
Globulin, Total: 1.8 g/dL (ref 1.5–4.5)
Glucose: 128 mg/dL — ABNORMAL HIGH (ref 70–99)
Potassium: 4.5 mmol/L (ref 3.5–5.2)
Sodium: 145 mmol/L — ABNORMAL HIGH (ref 134–144)
Total Protein: 6.5 g/dL (ref 6.0–8.5)
eGFR: 75 mL/min/{1.73_m2} (ref >59)

## 2022-02-03 LAB — VITAMIN B12: Vitamin B-12: 490 pg/mL (ref 232–1245)

## 2022-02-03 LAB — CK: Total CK: 145 U/L (ref 32–182)

## 2022-02-03 LAB — TSH: TSH: 4.22 u[IU]/mL (ref 0.450–4.500)

## 2022-02-27 ENCOUNTER — Telehealth: Payer: Self-pay | Admitting: Diagnostic Neuroimaging

## 2022-02-27 NOTE — Addendum Note (Signed)
Addended by: Darleen Crocker on: 02/27/2022 04:14 PM   Modules accepted: Orders

## 2022-02-27 NOTE — Telephone Encounter (Signed)
She is scheduled for MRI at [facility] 2/13 and asking for anxiety meds for the scan

## 2022-02-27 NOTE — Telephone Encounter (Signed)
Med sent to Dr Leta Baptist to review and send for the patient

## 2022-03-06 MED ORDER — ALPRAZOLAM 0.5 MG PO TABS: 0.5000 mg | ORAL_TABLET | Freq: Once | ORAL | 0 refills | Status: AC | PRN

## 2022-03-06 NOTE — Addendum Note (Signed)
Addended by: Andrey Spearman R on: 03/06/2022 09:28 AM   Modules accepted: Orders

## 2022-03-06 NOTE — Telephone Encounter (Signed)
Meds ordered this encounter  Medications   ALPRAZolam (XANAX) 0.5 MG tablet    Sig: Take 1 tablet (0.5 mg total) by mouth once as needed for up to 1 dose for anxiety (for MRI schedule 04/04/2022). Take 1 tablet 30 min prior to MRI. May take an additional tablet at the time of MRI. Please have a driver.    Dispense:  2 tablet    Refill:  0    Penni Bombard, MD 03/20/2079, 3:88 AM Certified in Neurology, Neurophysiology and [facility] [address], [place], [place] [address] [phone]

## 2022-03-24 ENCOUNTER — Telehealth: Payer: Self-pay | Admitting: Diagnostic Neuroimaging

## 2022-03-24 NOTE — Telephone Encounter (Signed)
Pt unable to keep current MRI appointment, please call to r/s pt

## 2022-03-27 ENCOUNTER — Encounter: Payer: Self-pay | Admitting: Diagnostic Neuroimaging

## 2022-03-27 NOTE — Telephone Encounter (Signed)
I called her and rescheduled for 2/21 at 3pm

## 2022-04-04 ENCOUNTER — Other Ambulatory Visit: Payer: BC Managed Care – PPO

## 2022-04-06 DIAGNOSIS — M76822 Posterior tibial tendinitis, left leg: Secondary | ICD-10-CM | POA: Diagnosis not present

## 2022-04-06 DIAGNOSIS — M25572 Pain in left ankle and joints of left foot: Secondary | ICD-10-CM | POA: Diagnosis not present

## 2022-04-11 NOTE — Telephone Encounter (Signed)
75 mins MR cervical w/wo & MR lumbar wo Dr. Conley Simmonds Josem Kaufmann: [id number] exp. 04/11/22-06/09/22

## 2022-04-12 ENCOUNTER — Ambulatory Visit: Payer: BC Managed Care – PPO

## 2022-04-12 DIAGNOSIS — R2689 Other abnormalities of gait and mobility: Secondary | ICD-10-CM | POA: Diagnosis not present

## 2022-04-12 DIAGNOSIS — R269 Unspecified abnormalities of gait and mobility: Secondary | ICD-10-CM | POA: Diagnosis not present

## 2022-04-12 DIAGNOSIS — M5416 Radiculopathy, lumbar region: Secondary | ICD-10-CM | POA: Diagnosis not present

## 2022-04-12 DIAGNOSIS — R292 Abnormal reflex: Secondary | ICD-10-CM | POA: Diagnosis not present

## 2022-04-12 DIAGNOSIS — R531 Weakness: Secondary | ICD-10-CM

## 2022-04-12 MED ORDER — GADOBENATE DIMEGLUMINE 529 MG/ML IV SOLN
20.0000 mL | Freq: Once | INTRAVENOUS | Status: AC | PRN
  Administered 2022-04-12: 20 mL via INTRAVENOUS

## 2022-04-13 ENCOUNTER — Encounter: Payer: Self-pay | Admitting: Neurology

## 2022-05-01 ENCOUNTER — Other Ambulatory Visit: Payer: Self-pay | Admitting: Family Medicine

## 2022-05-01 DIAGNOSIS — Z01419 Encounter for gynecological examination (general) (routine) without abnormal findings: Secondary | ICD-10-CM | POA: Diagnosis not present

## 2022-05-01 DIAGNOSIS — R7301 Impaired fasting glucose: Secondary | ICD-10-CM | POA: Diagnosis not present

## 2022-05-01 DIAGNOSIS — Z13220 Encounter for screening for lipoid disorders: Secondary | ICD-10-CM | POA: Diagnosis not present

## 2022-05-01 DIAGNOSIS — Z1231 Encounter for screening mammogram for malignant neoplasm of breast: Secondary | ICD-10-CM

## 2022-05-01 DIAGNOSIS — Z0000 Encounter for general adult medical examination without abnormal findings: Secondary | ICD-10-CM | POA: Diagnosis not present

## 2022-07-25 ENCOUNTER — Ambulatory Visit
Admission: RE | Admit: 2022-07-25 | Discharge: 2022-07-25 | Disposition: A | Discharge status: Home / Self Care | Payer: BC Managed Care – PPO | Source: Ambulatory Visit | Attending: Family Medicine | Admitting: Family Medicine

## 2022-07-25 DIAGNOSIS — Z1231 Encounter for screening mammogram for malignant neoplasm of breast: Secondary | ICD-10-CM | POA: Diagnosis not present

## 2022-07-27 ENCOUNTER — Other Ambulatory Visit: Payer: Self-pay | Admitting: Family Medicine

## 2022-07-27 DIAGNOSIS — R928 Other abnormal and inconclusive findings on diagnostic imaging of breast: Secondary | ICD-10-CM

## 2022-08-07 ENCOUNTER — Ambulatory Visit
Admission: RE | Admit: 2022-08-07 | Discharge: 2022-08-07 | Disposition: A | Discharge status: Home / Self Care | Payer: BC Managed Care – PPO | Source: Ambulatory Visit | Attending: Family Medicine | Admitting: Family Medicine

## 2022-08-07 ENCOUNTER — Other Ambulatory Visit: Payer: Self-pay | Admitting: Family Medicine

## 2022-08-07 DIAGNOSIS — R928 Other abnormal and inconclusive findings on diagnostic imaging of breast: Secondary | ICD-10-CM

## 2022-08-07 DIAGNOSIS — N6341 Unspecified lump in right breast, subareolar: Secondary | ICD-10-CM | POA: Diagnosis not present

## 2022-08-11 ENCOUNTER — Ambulatory Visit
Admission: RE | Admit: 2022-08-11 | Discharge: 2022-08-11 | Disposition: A | Discharge status: Home / Self Care | Payer: BC Managed Care – PPO | Source: Ambulatory Visit | Attending: Family Medicine | Admitting: Family Medicine

## 2022-08-11 DIAGNOSIS — N6315 Unspecified lump in the right breast, overlapping quadrants: Secondary | ICD-10-CM | POA: Diagnosis not present

## 2022-08-11 DIAGNOSIS — R928 Other abnormal and inconclusive findings on diagnostic imaging of breast: Secondary | ICD-10-CM

## 2022-08-11 DIAGNOSIS — N6341 Unspecified lump in right breast, subareolar: Secondary | ICD-10-CM | POA: Diagnosis not present

## 2022-08-11 HISTORY — PX: BREAST BIOPSY: SHX20

## 2022-08-14 ENCOUNTER — Other Ambulatory Visit: Payer: Self-pay | Admitting: Family Medicine

## 2022-08-14 DIAGNOSIS — N6310 Unspecified lump in the right breast, unspecified quadrant: Secondary | ICD-10-CM

## 2022-08-23 ENCOUNTER — Ambulatory Visit
Admission: RE | Admit: 2022-08-23 | Discharge: 2022-08-23 | Disposition: A | Discharge status: Home / Self Care | Payer: BC Managed Care – PPO | Source: Ambulatory Visit | Attending: Family Medicine | Admitting: Family Medicine

## 2022-08-23 DIAGNOSIS — N6310 Unspecified lump in the right breast, unspecified quadrant: Secondary | ICD-10-CM | POA: Diagnosis not present

## 2022-08-23 DIAGNOSIS — N6011 Diffuse cystic mastopathy of right breast: Secondary | ICD-10-CM | POA: Diagnosis not present

## 2022-08-23 DIAGNOSIS — N6341 Unspecified lump in right breast, subareolar: Secondary | ICD-10-CM | POA: Diagnosis not present

## 2022-08-23 HISTORY — PX: BREAST BIOPSY: SHX20

## 2023-01-02 DIAGNOSIS — Z800 Family history of malignant neoplasm of digestive organs: Secondary | ICD-10-CM | POA: Diagnosis not present

## 2023-01-02 DIAGNOSIS — K5730 Diverticulosis of large intestine without perforation or abscess without bleeding: Secondary | ICD-10-CM | POA: Diagnosis not present

## 2023-01-02 DIAGNOSIS — D120 Benign neoplasm of cecum: Secondary | ICD-10-CM | POA: Diagnosis not present

## 2023-01-02 DIAGNOSIS — D122 Benign neoplasm of ascending colon: Secondary | ICD-10-CM | POA: Diagnosis not present

## 2023-01-02 DIAGNOSIS — Z1211 Encounter for screening for malignant neoplasm of colon: Secondary | ICD-10-CM | POA: Diagnosis not present

## 2023-06-18 DIAGNOSIS — H6693 Otitis media, unspecified, bilateral: Secondary | ICD-10-CM | POA: Diagnosis not present

## 2023-06-18 DIAGNOSIS — R7301 Impaired fasting glucose: Secondary | ICD-10-CM | POA: Diagnosis not present

## 2023-06-18 DIAGNOSIS — Z0000 Encounter for general adult medical examination without abnormal findings: Secondary | ICD-10-CM | POA: Diagnosis not present

## 2023-06-18 DIAGNOSIS — R748 Abnormal levels of other serum enzymes: Secondary | ICD-10-CM | POA: Diagnosis not present

## 2023-06-18 DIAGNOSIS — E782 Mixed hyperlipidemia: Secondary | ICD-10-CM | POA: Diagnosis not present

## 2023-06-18 DIAGNOSIS — E669 Obesity, unspecified: Secondary | ICD-10-CM | POA: Diagnosis not present

## 2023-11-19 DIAGNOSIS — L820 Inflamed seborrheic keratosis: Secondary | ICD-10-CM | POA: Diagnosis not present

## 2023-11-19 DIAGNOSIS — L570 Actinic keratosis: Secondary | ICD-10-CM | POA: Diagnosis not present

## 2024-04-30 IMAGING — US US ABDOMEN LIMITED
1 series · 14 of 25 positions shown · non-contrast
Comparison: None Available.

CLINICAL DATA: Elevated LFTs

EXAM:
ULTRASOUND ABDOMEN LIMITED RIGHT UPPER QUADRANT

[Series 1: us abdomen limited · 0.19mm/px · 14 of 48 slices shown]
[im 1/48]
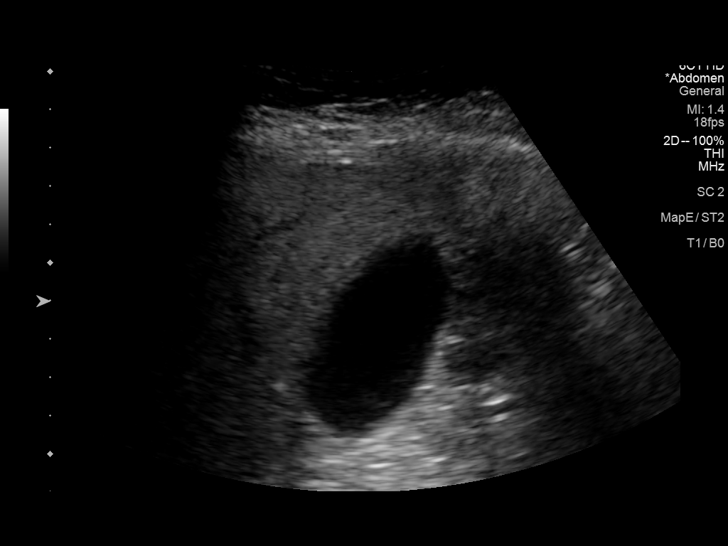
[im 4/48]
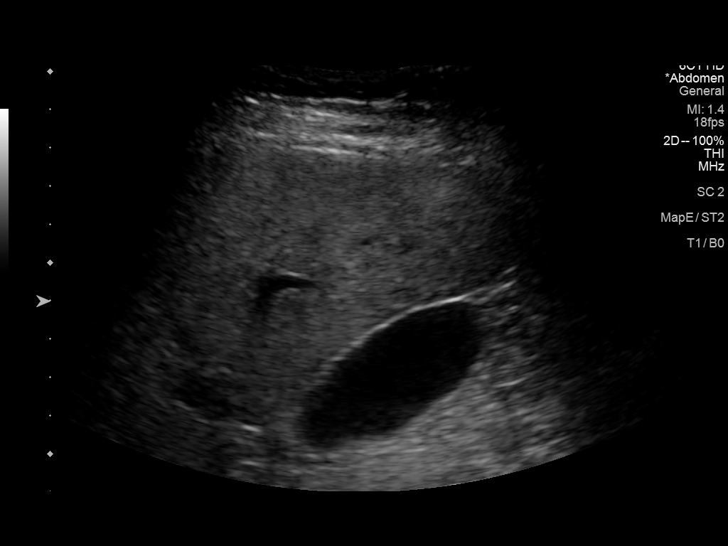
[im 8/48]
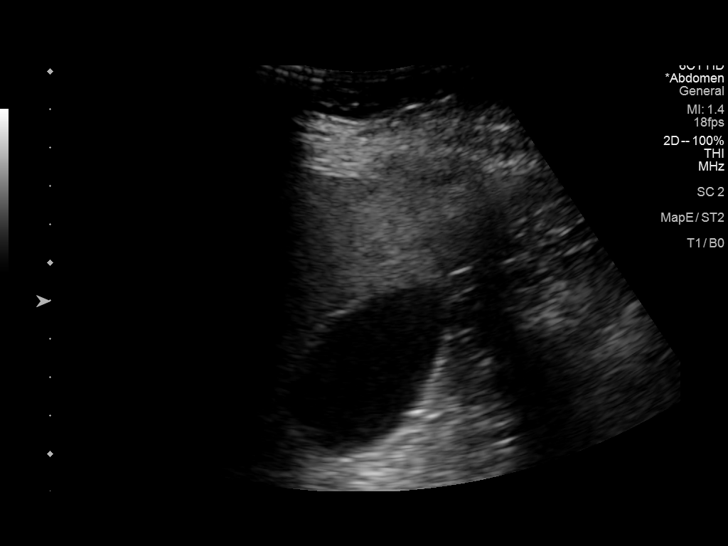
[im 12/48]
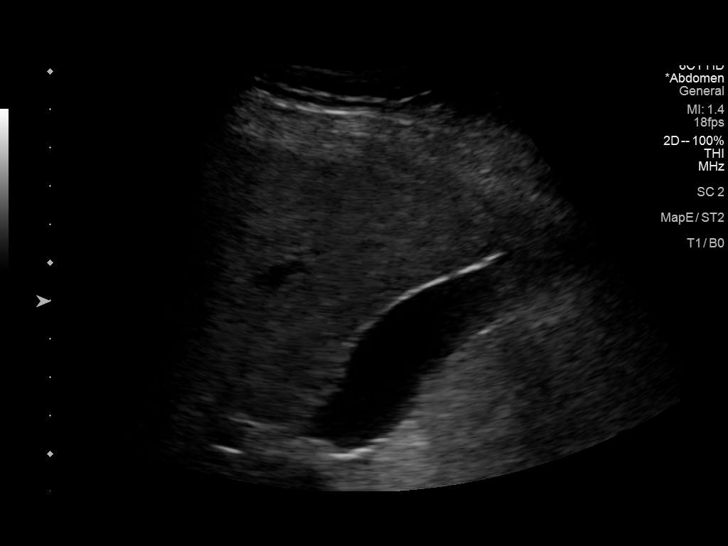
[im 16/48]
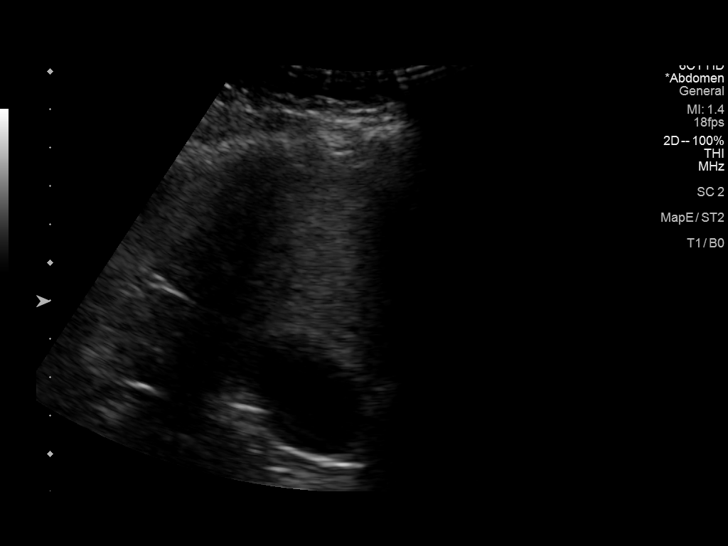
[im 18/48]
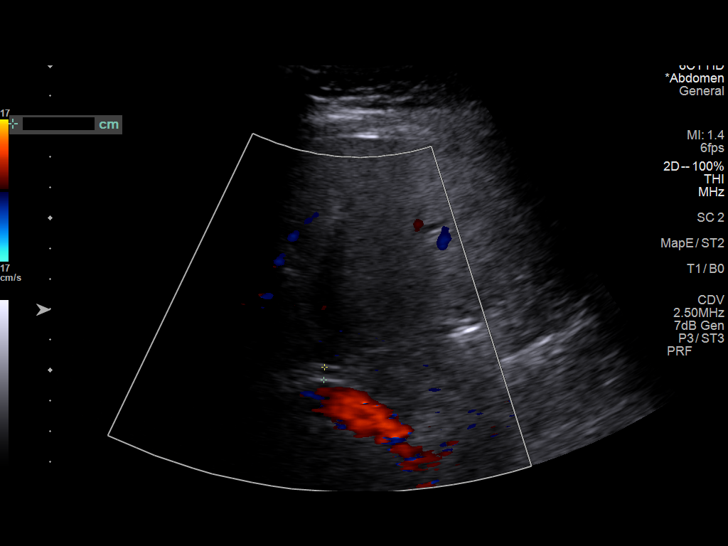
[im 22/48]
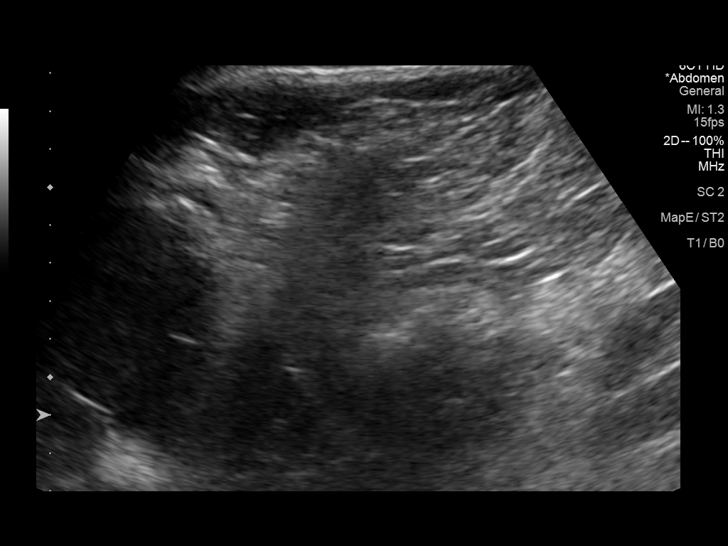
[im 26/48]
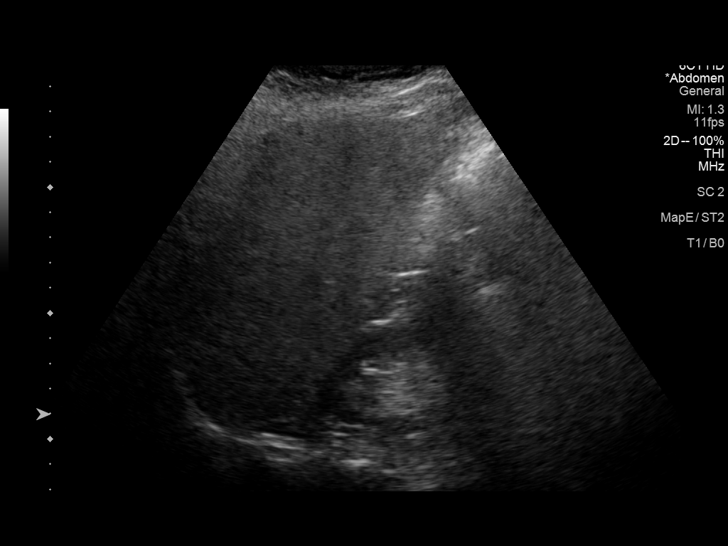
[im 30/48]
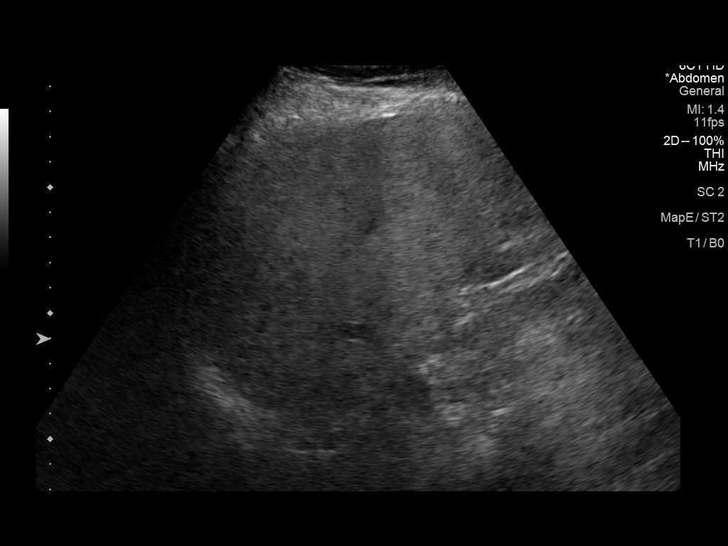
[im 32/48]
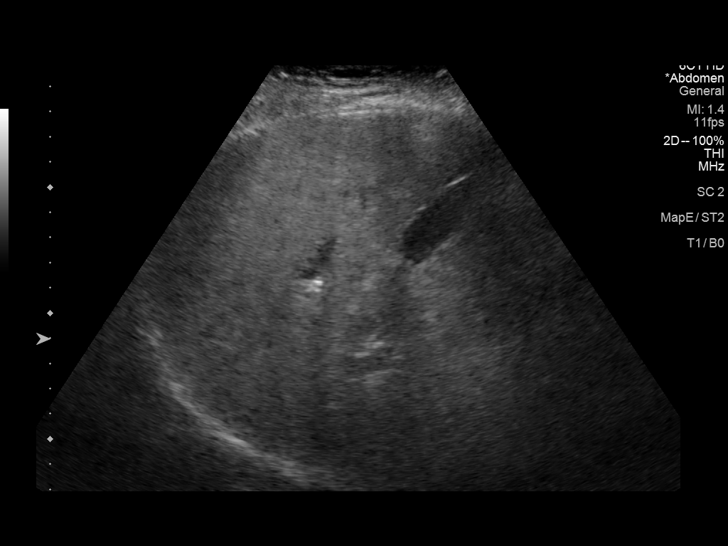
[im 36/48]
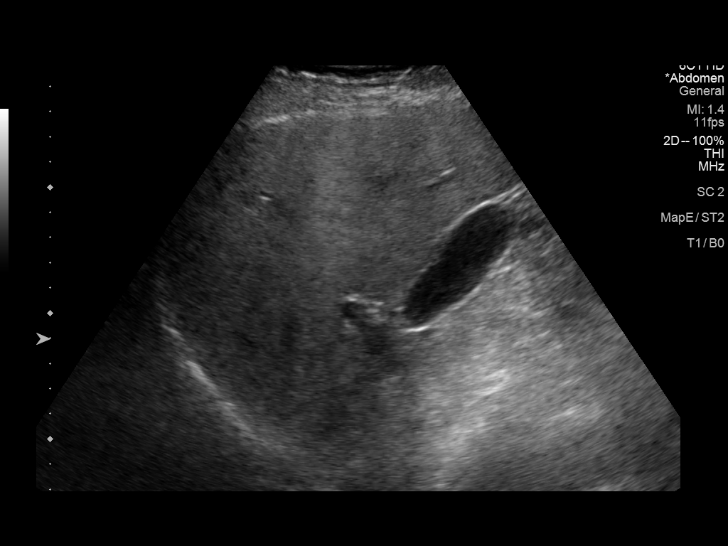
[im 40/48]
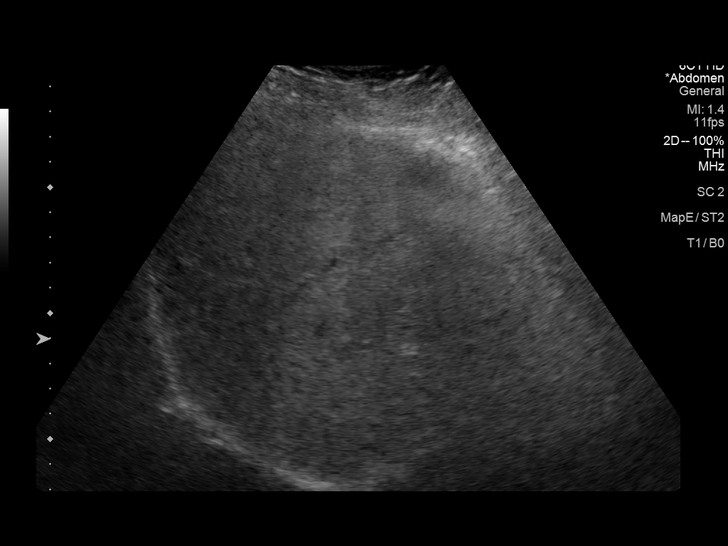
[im 44/48]
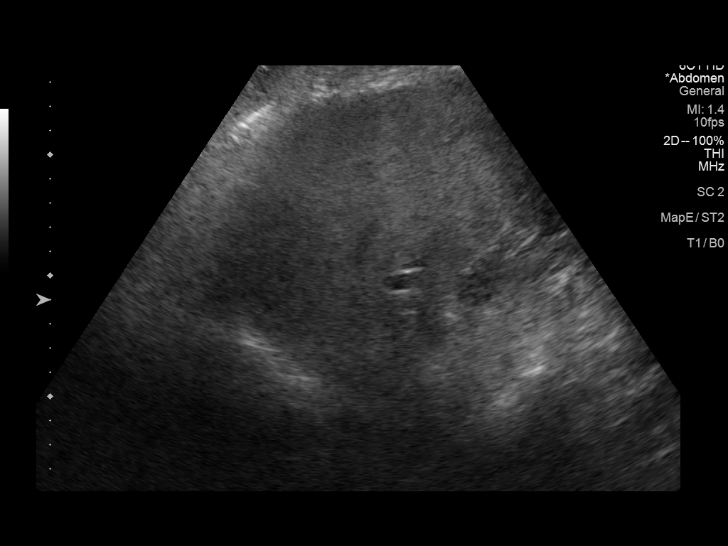
[im 48/48]
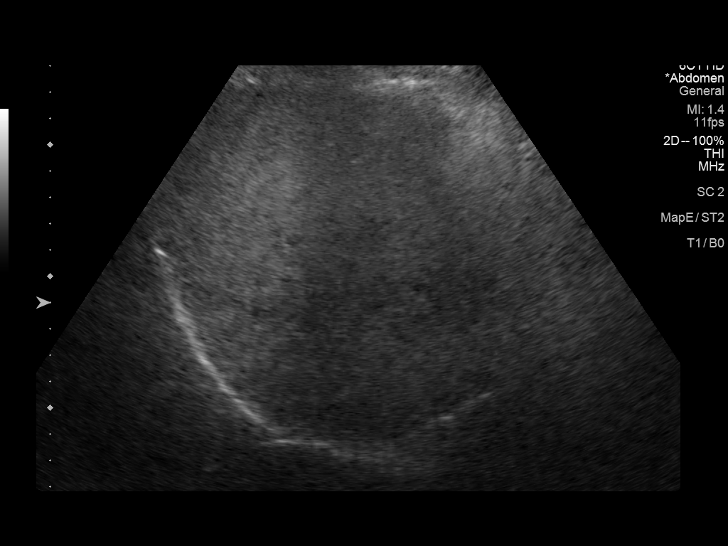

[14 of 25 positions shown; findings below may reference images not displayed]

FINDINGS: Gallbladder:

No gallstones or wall thickening visualized. No sonographic Murphy
sign noted by sonographer.

Common bile duct:

Diameter: 4 mm

Liver:

Increased echogenicity. No focal lesion. Portal vein is patent on
color Doppler imaging with normal direction of blood flow towards
the liver.

Other: None.
IMPRESSION: Increased hepatic parenchymal echogenicity suggestive of steatosis.

No cholelithiasis or sonographic evidence for acute cholecystitis.
